# Patient Record
Sex: Male | Born: 1980 | Race: Black or African American | Hispanic: No | Marital: Married | Smoking: Current every day smoker
Health system: Southern US, Community
[De-identification: ages and names within clinical notes are randomized; demographics above are authoritative.]

## PROBLEM LIST (undated history)

## (undated) DIAGNOSIS — R002 Palpitations: Secondary | ICD-10-CM

## (undated) DIAGNOSIS — I4891 Unspecified atrial fibrillation: Secondary | ICD-10-CM

## (undated) DIAGNOSIS — I10 Essential (primary) hypertension: Secondary | ICD-10-CM

## (undated) HISTORY — DX: Palpitations: R00.2

## (undated) HISTORY — DX: Essential (primary) hypertension: I10

---

## 2012-10-20 LAB — PROTIME-INR

## 2013-11-26 ENCOUNTER — Ambulatory Visit (INDEPENDENT_AMBULATORY_CARE_PROVIDER_SITE_OTHER): Payer: Self-pay | Admitting: Cardiovascular Disease

## 2013-11-26 ENCOUNTER — Encounter: Payer: Self-pay | Admitting: Cardiovascular Disease

## 2013-11-26 VITALS — BP 162/110 | HR 73 | Ht 70.5 in | Wt 255.5 lb

## 2013-11-26 DIAGNOSIS — I4891 Unspecified atrial fibrillation: Secondary | ICD-10-CM

## 2013-11-26 DIAGNOSIS — G4733 Obstructive sleep apnea (adult) (pediatric): Secondary | ICD-10-CM | POA: Insufficient documentation

## 2013-11-26 DIAGNOSIS — R5381 Other malaise: Secondary | ICD-10-CM

## 2013-11-26 DIAGNOSIS — I10 Essential (primary) hypertension: Secondary | ICD-10-CM

## 2013-11-26 DIAGNOSIS — R5383 Other fatigue: Secondary | ICD-10-CM

## 2013-11-26 DIAGNOSIS — I480 Paroxysmal atrial fibrillation: Secondary | ICD-10-CM | POA: Insufficient documentation

## 2013-11-26 DIAGNOSIS — Z720 Tobacco use: Secondary | ICD-10-CM | POA: Insufficient documentation

## 2013-11-26 DIAGNOSIS — E668 Other obesity: Secondary | ICD-10-CM | POA: Insufficient documentation

## 2013-11-26 DIAGNOSIS — I119 Hypertensive heart disease without heart failure: Secondary | ICD-10-CM

## 2013-11-26 DIAGNOSIS — F17200 Nicotine dependence, unspecified, uncomplicated: Secondary | ICD-10-CM

## 2013-11-26 DIAGNOSIS — E785 Hyperlipidemia, unspecified: Secondary | ICD-10-CM

## 2013-11-26 DIAGNOSIS — E669 Obesity, unspecified: Secondary | ICD-10-CM

## 2013-11-26 MED ORDER — DILTIAZEM HCL ER COATED BEADS 180 MG PO CP24: ORAL_CAPSULE | ORAL | Status: DC

## 2013-11-26 NOTE — Patient Instructions (Signed)
Your physician recommends that you return for lab work fasting.  Your physician has recommended that you have a sleep study. This test records several body functions during sleep, including: brain activity, eye movement, oxygen and carbon dioxide blood levels, heart rate and rhythm, breathing rate and rhythm, the flow of air through your mouth and nose, snoring, body muscle movements, and chest and belly movement.  Your physician has requested that you have an echocardiogram. Echocardiography is a painless test that uses sound waves to create images of your heart. It provides your doctor with information about the size and shape of your heart and how well your heart's chambers and valves are working. This procedure takes approximately one hour. There are no restrictions for this procedure.  .Your physician has recommended you make the following change in your medication: start new prescription for diltiazem 180 mg. This has already been sent to the pharmacy.  Start ASA 81 mg.  Your physician recommends that you schedule a follow-up appointment in: 2-3 months.

## 2013-11-26 NOTE — Progress Notes (Addendum)
Patient ID: Erik Walter, male   DOB: [birth year], 33 y.o.   MRN: [id number]     PATIENT PROFILE: Erik Walter is a 33 y.o. male who is referred for cardiology evaluation, having recently moved to the [place] area from [place].   HPI:  Erik Walter is a 33 y.o. male who admits to a history of paroxysmal atrial fibrillation.  He tells me that while living in [place].  He was hospitalized on 3 occasions in 2011, 2013, and most recently in 2014 for atrial fibrillation.  Remotely, he had been on Cardizem, and following his last episode Ranexa was even added to his medical regimen.  He does have a history of hemorrhoids.  He developed some mild rectal bleeding on Xarelto.  Ultimately, he has stopped taking all his medications.  He moved to the [place] area approximately 3 months ago.  He now presents to establish cardiology care.  He denies episodes of chest pain.  History is notable in that he started smoking approximately 2 years ago.  Upon further questioning, the patient does snore very loudly.  His sleep is nonrestorative.  He does have frequent awakenings.  He also had daytime sleepiness.  He has never been evaluated for sleep apnea.   Past Medical History  Diagnosis Date  . Hypertension     History reviewed. No pertinent past surgical history.  Not on File  Current Outpatient Prescriptions  Medication Sig Dispense Refill  . diltiazem (CARDIZEM CD) 180 MG 24 hr capsule Take one capsule at night  30 capsule  6   No current facility-administered medications for this visit.    Socially, he is recently married for 7 months.  However, he has 3 children, ages 614, 4010, and 2.  He does smoke cigarettes for the past 2-1/2 years, currently one half pack per day.  He does drink occasional beer 3 days per week.  He does not routinely exercise.  He completed 11 grades of education.  He currently is unemployed.  Family History  Problem Relation Age of Onset  . Alcoholism Mother     . Other Father     motocycle accident  . Other Sister     P 10 MUTATION;  cowden syndrome  . Cancer - Other Maternal Grandmother     ROS General: Negative; No fevers, chills, or night sweats HEENT: Negative; No changes in vision or hearing, sinus congestion, difficulty swallowing Pulmonary: Negative; No cough, wheezing, shortness of breath, hemoptysis Cardiovascular:  See HPI; No chest pain, presyncope, syncope, palpitations, edema GI: Negative; No nausea, vomiting, diarrhea, or abdominal pain GU: Negative; No dysuria, hematuria, or difficulty voiding Musculoskeletal: Negative; no myalgias, joint pain, or weakness Hematologic/Oncologic: Negative; no easy bruising, bleeding Endocrine: Negative; no heat/cold intolerance; no diabetes Neuro: Negative; no changes in balance, headaches Skin: Negative; No rashes or skin lesions Psychiatric: Negative; No behavioral problems, depression Sleep: Loud snoring, daytime sleepiness, hypersomnolence; no bruxism, restless legs, hypnogagnic hallucinations Other comprehensive 14 point system review is negative   Physical Exam BP 162/110  Pulse 73  Ht 5' 10.5" (1.791 m)  Wt 255 lb 8 oz (115.894 kg)  BMI 36.13 kg/m2 Repeat blood pressure was 150/92 retaken by me. General: Alert, oriented, no distress.  Skin: normal turgor, no rashes, warm and dry HEENT: Normocephalic, atraumatic. Pupils equal round and reactive to light; sclera anicteric; extraocular muscles intact; Fundi mild arterial narrowing Nose without nasal septal hypertrophy Mouth/Parynx benign; Mallinpatti scale 3 Neck: Very thick neck ;No JVD, no carotid bruits; normal carotid  upstroke Lungs: clear to ausculatation and percussion; no wheezing or rales Chest wall: without tenderness to palpitation Heart: PMI not displaced, RRR, s1 s2 normal, 1/6 systolic murmur, no diastolic murmur, no rubs, gallops, thrills, or heaves Abdomen: soft, nontender; no hepatosplenomehaly, BS+; abdominal  aorta nontender and not dilated by palpation. Back: no CVA tenderness Pulses 2+ Musculoskeletal: full range of motion, normal strength, no joint deformities Extremities: Trivial ankle edema no clubbing cyanosis, Homan's sign negative  Neurologic: grossly nonfocal; Cranial nerves grossly wnl Psychologic: Normal mood and affect   ECG (independently read by me): Normal sinus rhythm at 73 beats per minute.  Nondiagnostic T changes in leads 3 aVF and V3.  LABS:  BMET No results found for this basename: na, k, cl, co2, glucose, bun, creatinine, calcium, gfrnonaa, gfraa     Hepatic Function Panel  No results found for this basename: prot, albumin, ast, alt, alkphos, bilitot, bilidir, ibili     CBC No results found for this basename: wbc, rbc, hgb, hct, plt, mcv, mch, mchc, rdw, neutrabs, lymphsabs, monoabs, eosabs, basosabs     BNP No results found for this basename: probnp    Lipid Panel  No results found for this basename: chol, trig, hdl, cholhdl, vldl, ldlcalc      RADIOLOGY: No results found.   ASSESSMENT AND PLAN: Erik Walter is a 33 year old, African American male, who has a history of obesity, and apparently has been hospitalized on 3 different occasions for atrial fibrillation dating back to 2011.  Remotely, he had been on rate control medication, as well as anticoagulation with Xarelto.  Currently, he has stopped all medication.  His physical habitus and history is also highly suggestive of obstructive sleep apnea, which may be playing a role in his  Atrial fibrillation development, as well as his current blood pressure elevation.  I discussed at length normal sleep architecture and the adverse effects of obstructive sleep apnea on normal sleep pattern, as well as increased cardiovascular risk and particularly with reference to atrial fibrillation, and hypertension.  I also had a long discussion with him with counseling and strongly recommended discontinuance of his  recent tobacco habit.  Presently, I have recommended the addition of a baby aspirin 81 mg.  I scheduled him for a complete set of blood work to be done in the fasting state, consisting of Cmet, CBC, magnesium, vitamin D level, thyroid function studies, and lipid panel.  He will  undergo a 2-D echo Doppler study to evaluate systolic as well as diastolic function, as well as chamber dimensions. I am scheduling him for a diagnostic polysomnogram due to the high likelihood of having obstructive sleep apnea.  I prescribed Cardizem CD 180 mg, which be helpful both for rate control and blood pressure control which he states he tolerated this well in the past.  We discussed the importance of weight loss and smoking cessation.  I will see him back in the office in 2-3 months for followup evaluation and further recommendations will be made at that time.   Lennette Biharihomas A. Kelly, MD, [facility] 11/26/2013 6:37 PM

## 2013-12-13 ENCOUNTER — Ambulatory Visit (HOSPITAL_COMMUNITY)
Admission: RE | Admit: 2013-12-13 | Discharge: 2013-12-13 | Disposition: A | Discharge status: Home / Self Care | Payer: Medicaid Other | Source: Ambulatory Visit | Attending: Cardiovascular Disease | Admitting: Cardiovascular Disease

## 2013-12-13 DIAGNOSIS — E785 Hyperlipidemia, unspecified: Secondary | ICD-10-CM | POA: Insufficient documentation

## 2013-12-13 DIAGNOSIS — R5381 Other malaise: Secondary | ICD-10-CM

## 2013-12-13 DIAGNOSIS — I4891 Unspecified atrial fibrillation: Secondary | ICD-10-CM | POA: Diagnosis not present

## 2013-12-13 DIAGNOSIS — I119 Hypertensive heart disease without heart failure: Secondary | ICD-10-CM | POA: Insufficient documentation

## 2013-12-13 DIAGNOSIS — R5383 Other fatigue: Secondary | ICD-10-CM

## 2013-12-13 DIAGNOSIS — I517 Cardiomegaly: Secondary | ICD-10-CM

## 2013-12-13 LAB — LIPID PANEL
CHOL/HDL RATIO: 4.1 ratio
CHOLESTEROL: 264 mg/dL — AB (ref 0–200)
HDL: 64 mg/dL (ref >39)
LDL Cholesterol: 173 mg/dL — ABNORMAL HIGH (ref 0–99)
TRIGLYCERIDES: 137 mg/dL (ref <150)
VLDL: 27 mg/dL (ref 0–40)

## 2013-12-13 LAB — CBC
HCT: 43.5 % (ref 39.0–52.0)
Hemoglobin: 14.7 g/dL (ref 13.0–17.0)
MCH: 28.3 pg (ref 26.0–34.0)
MCHC: 33.8 g/dL (ref 30.0–36.0)
MCV: 83.8 fL (ref 78.0–100.0)
Platelets: 251 10*3/uL (ref 150–400)
RBC: 5.19 MIL/uL (ref 4.22–5.81)
RDW: 14.4 % (ref 11.5–15.5)
WBC: 5.3 10*3/uL (ref 4.0–10.5)

## 2013-12-13 LAB — COMPREHENSIVE METABOLIC PANEL
ALT: 39 U/L (ref 0–53)
AST: 29 U/L (ref 0–37)
Albumin: 4.6 g/dL (ref 3.5–5.2)
Alkaline Phosphatase: 54 U/L (ref 39–117)
BUN: 13 mg/dL (ref 6–23)
CALCIUM: 9.7 mg/dL (ref 8.4–10.5)
CO2: 27 mEq/L (ref 19–32)
Chloride: 101 mEq/L (ref 96–112)
Creat: 1.34 mg/dL (ref 0.50–1.35)
Glucose, Bld: 100 mg/dL — ABNORMAL HIGH (ref 70–99)
Potassium: 4.2 mEq/L (ref 3.5–5.3)
Sodium: 138 mEq/L (ref 135–145)
Total Bilirubin: 0.4 mg/dL (ref 0.2–1.2)
Total Protein: 7.3 g/dL (ref 6.0–8.3)

## 2013-12-13 LAB — T3, FREE: T3 FREE: 2.8 pg/mL (ref 2.3–4.2)

## 2013-12-13 LAB — T4, FREE: Free T4: 1.15 ng/dL (ref 0.80–1.80)

## 2013-12-13 LAB — MAGNESIUM: Magnesium: 1.7 mg/dL (ref 1.5–2.5)

## 2013-12-13 LAB — TSH: TSH: 1.243 u[IU]/mL (ref 0.350–4.500)

## 2013-12-13 NOTE — Progress Notes (Signed)
2D Echocardiogram Complete.  12/13/2013   Bret Stamour, RDCS  

## 2013-12-16 LAB — VITAMIN D 1,25 DIHYDROXY
VITAMIN D3 1, 25 (OH): 56 pg/mL
Vitamin D 1, 25 (OH)2 Total: 56 pg/mL (ref 18–72)

## 2013-12-18 ENCOUNTER — Telehealth: Payer: Self-pay | Admitting: Cardiovascular Disease

## 2013-12-18 NOTE — Telephone Encounter (Signed)
Called c/o weakness, dizziness and depression since starting Cardizem 180mg .  He is taking at bedtime because he had similar symptoms in the past on 120mg  taking in the AM.  Also wants Dr. Tresa EndoKelly to be aware his father is a [place] Vet and was in contact with Agent Orange and was told this could be transferred to children born after the fact.  Will have Dr. Tresa EndoKelly review and advise.

## 2013-12-18 NOTE — Telephone Encounter (Signed)
Please call,having problem with his Cardizem.

## 2013-12-18 NOTE — Telephone Encounter (Signed)
Would take at night. Re-check BP; [facility]

## 2013-12-19 NOTE — Telephone Encounter (Signed)
Called back w/Dr. Landry DykeKelly's recommendations.  Told to get a BP monitor to check BP's at home.  States he did not take Cardizem last PM and feels better but doesn't know what his BP is today.  Expressed importance of taking meds for heart rhythm and BP control.  Patient very worked up this AM.  Asked about Sleep Study - order is in will discuss w/scheduling to see when this can be done.  Asked about recent labs and echo results.  Preliminary results given.  Will schedule appt w/Dr. Tresa EndoKelly or APP next available.  Patient voiced understanding.

## 2013-12-20 ENCOUNTER — Encounter: Payer: Self-pay | Admitting: Cardiology

## 2013-12-20 ENCOUNTER — Ambulatory Visit (INDEPENDENT_AMBULATORY_CARE_PROVIDER_SITE_OTHER): Payer: Medicaid Other | Admitting: Cardiology

## 2013-12-20 VITALS — BP 140/104 | HR 98 | Ht 70.5 in | Wt 245.4 lb

## 2013-12-20 DIAGNOSIS — F17200 Nicotine dependence, unspecified, uncomplicated: Secondary | ICD-10-CM

## 2013-12-20 DIAGNOSIS — I4891 Unspecified atrial fibrillation: Secondary | ICD-10-CM

## 2013-12-20 DIAGNOSIS — Z79899 Other long term (current) drug therapy: Secondary | ICD-10-CM

## 2013-12-20 DIAGNOSIS — Z720 Tobacco use: Secondary | ICD-10-CM

## 2013-12-20 DIAGNOSIS — I10 Essential (primary) hypertension: Secondary | ICD-10-CM

## 2013-12-20 DIAGNOSIS — E785 Hyperlipidemia, unspecified: Secondary | ICD-10-CM

## 2013-12-20 DIAGNOSIS — I480 Paroxysmal atrial fibrillation: Secondary | ICD-10-CM

## 2013-12-20 MED ORDER — TELMISARTAN 40 MG PO TABS
40.0000 mg | ORAL_TABLET | Freq: Every day | ORAL | Status: DC
Start: 2013-12-20 — End: 2013-12-20

## 2013-12-20 MED ORDER — TELMISARTAN 40 MG PO TABS: 40.0000 mg | ORAL_TABLET | Freq: Every day | ORAL | Status: DC

## 2013-12-20 NOTE — Progress Notes (Addendum)
12/26/2013   PCP: No PCP Per Patient   Chief Complaint  Patient presents with  . Follow-up    results, HTN    Primary Cardiologist:Dr. Bishop Limbo. Kelly   HPI:33 y.o. male who admits to a history of paroxysmal atrial fibrillation. While living in [place] he was hospitalized on 3 occasions in 2011, 2013, and most recently in 2014 for atrial fibrillation. Remotely, he had been on Cardizem, and following his last episode Ranexa was even added to his medical regimen. He does have a history of hemorrhoids. He developed some mild rectal bleeding on Xarelto. Ultimately, he had stopped taking all his medications. [place] to the [place] area -he saw Dr. Tresa EndoKelly for cardiology care.  He denies episodes of chest pain. History is notable in that he started smoking approximately 2 years ago. Upon further questioning, the patient does snore very loudly. His sleep is nonrestorative. He does have frequent awakenings. He also had daytime sleepiness. He has never been evaluated for sleep apnea.      Dr. Tresa EndoKelly ordered an echo. And started ASA 81 mg daily. Also sleep study. Also tobacco cessation was discussed and continues to be.    ECHO: Left ventricle: The cavity size was normal. Wall thickness was increased in a pattern of mild LVH. Systolic function was normal. The estimated ejection fraction was in the range of 55% to 60%. Wall motion was normal; there were no regional wall motion abnormalities. The study is not technically sufficient to allow evaluation of LV diastolic function. - Left atrium: LA Volume/BSA= 12.6 ml/m2. The atrium was normal in size. - Right atrium: The atrium was normal in size.   Today his BP is elevated.  He complains of near syncope, no energy and some edema of his feet. He has held BP meds because he believes it to make him feel bad.  No Known Allergies  Current Outpatient Prescriptions  Medication Sig Dispense Refill  . diltiazem (CARDIZEM CD) 180 MG 24 hr  capsule Take one capsule at night  30 capsule  6  . telmisartan (MICARDIS) 40 MG tablet Take 1 tablet (40 mg total) by mouth daily.  30 tablet  5   No current facility-administered medications for this visit.    Past Medical History  Diagnosis Date  . Hypertension     History reviewed. No pertinent past surgical history.  WUJ:WJXBJYN:WGROS:General:no colds or fevers,  weight down 10 pounds Skin:no rashes or ulcers HEENT:no blurred vision, no congestion CV:see HPI PUL:see HPI GI:no diarrhea constipation or melena, no indigestion GU:no hematuria, no dysuria MS:no joint pain, no claudication Neuro:no syncope, no lightheadedness Endo:no diabetes, no thyroid disease  Wt Readings from Last 3 Encounters:  12/20/13 245 lb 6.4 oz (111.313 kg)  11/26/13 255 lb 8 oz (115.894 kg)   CMP     Component Value Date/Time   NA 138 12/13/2013 0825   K 4.2 12/13/2013 0825   CL 101 12/13/2013 0825   CO2 27 12/13/2013 0825   GLUCOSE 100* 12/13/2013 0825   BUN 13 12/13/2013 0825   CREATININE 1.34 12/13/2013 0825   CALCIUM 9.7 12/13/2013 0825   PROT 7.3 12/13/2013 0825   ALBUMIN 4.6 12/13/2013 0825   AST 29 12/13/2013 0825   ALT 39 12/13/2013 0825   ALKPHOS 54 12/13/2013 0825   BILITOT 0.4 12/13/2013 0825   Lipid Panel     Component Value Date/Time   CHOL 264* 12/13/2013 0823   TRIG 137 12/13/2013 [id number]  HDL 64 12/13/2013 0823   CHOLHDL 4.1 12/13/2013 0823   VLDL 27 12/13/2013 0823   LDLCALC 173* 12/13/2013 0823     PHYSICAL EXAM BP 140/104  Pulse 98  Ht 5' 10.5" (1.791 m)  Wt 245 lb 6.4 oz (111.313 kg)  BMI 34.70 kg/m2  Lying BP 143/106, P 93 Sitting 153/110. P 104 Standing 181/155  P107  General:Pleasant affect, NAD Skin:Warm and dry, brisk capillary refill HEENT:normocephalic, sclera clear, mucus membranes moist Neck:supple, no JVD, no bruits  Heart:S1S2 RRR with soft systolic 1/6 murmur, no gallup, rub or click Lungs:clear without rales, rhonchi, or wheezes NGE:XBMW, non tender, + BS, do not  palpate liver spleen or masses Ext:no lower ext edema, 2+ pedal pulses, 2+ radial pulses Neuro:alert and oriented, MAE, follows commands, + facial symmetry EKG:SR no acute changes  ASSESSMENT AND PLAN Essential hypertension Uncontrolled, micardis 40 mg daily added.  Paroxysmal atrial fibrillation Maintaining SR  Tobacco abuse Encouraged to stop  Hyperlipidemia Lipid Panel     Component Value Date/Time   CHOL 264* 12/13/2013 0823   TRIG 137 12/13/2013 0823   HDL 64 12/13/2013 0823   CHOLHDL 4.1 12/13/2013 0823   VLDL 27 12/13/2013 0823   LDLCALC 173* 12/13/2013 0823   Will need to add statin, but with side effects from meds will hold for now.     Will follow up in 2 weeks on day with Dr. Bishop Limbo in office.

## 2013-12-20 NOTE — Patient Instructions (Signed)
Erik BoozerLaura Ingold, NP has recommended making the following medication changes:  START Micardis 40 mg - take 1 tablet daily  A prescription has been sent to your pharmacy electronically.  [name] has ordered you to have blood work done [employer]. (BMP)  Your physician recommends that you schedule a follow-up appointment in 2 weeks with Erik BoozerLaura Ingold, NP on a day when Dr Tresa EndoKelly is in the office.

## 2013-12-25 ENCOUNTER — Encounter: Payer: Self-pay | Admitting: Cardiology

## 2013-12-25 NOTE — Assessment & Plan Note (Signed)
Uncontrolled, micardis 40 mg daily added.

## 2013-12-26 DIAGNOSIS — E785 Hyperlipidemia, unspecified: Secondary | ICD-10-CM | POA: Insufficient documentation

## 2013-12-26 NOTE — Assessment & Plan Note (Signed)
Lipid Panel     Component Value Date/Time   CHOL 264* 12/13/2013 0823   TRIG 137 12/13/2013 0823   HDL 64 12/13/2013 0823   CHOLHDL 4.1 12/13/2013 0823   VLDL 27 12/13/2013 0823   LDLCALC 173* 12/13/2013 0823   Will need to add statin, but with side effects from meds will hold for now.

## 2013-12-26 NOTE — Assessment & Plan Note (Signed)
Maintaining SR.    

## 2013-12-26 NOTE — Assessment & Plan Note (Signed)
Encouraged to stop. 

## 2013-12-27 ENCOUNTER — Other Ambulatory Visit: Payer: Self-pay | Admitting: *Deleted

## 2013-12-27 DIAGNOSIS — R5383 Other fatigue: Secondary | ICD-10-CM

## 2013-12-27 DIAGNOSIS — G4733 Obstructive sleep apnea (adult) (pediatric): Secondary | ICD-10-CM

## 2013-12-27 DIAGNOSIS — E785 Hyperlipidemia, unspecified: Secondary | ICD-10-CM

## 2013-12-27 DIAGNOSIS — I4891 Unspecified atrial fibrillation: Secondary | ICD-10-CM

## 2013-12-27 DIAGNOSIS — R5381 Other malaise: Secondary | ICD-10-CM

## 2013-12-27 DIAGNOSIS — I119 Hypertensive heart disease without heart failure: Secondary | ICD-10-CM

## 2013-12-27 LAB — BASIC METABOLIC PANEL
BUN: 10 mg/dL (ref 6–23)
CO2: 26 meq/L (ref 19–32)
Calcium: 9.8 mg/dL (ref 8.4–10.5)
Chloride: 101 mEq/L (ref 96–112)
Creat: 1.19 mg/dL (ref 0.50–1.35)
Glucose, Bld: 78 mg/dL (ref 70–99)
Potassium: 4.5 mEq/L (ref 3.5–5.3)
Sodium: 138 mEq/L (ref 135–145)

## 2013-12-30 ENCOUNTER — Telehealth: Payer: Self-pay

## 2013-12-30 MED ORDER — LISINOPRIL 40 MG PO TABS: 40.0000 mg | ORAL_TABLET | Freq: Every day | ORAL | Status: AC

## 2013-12-30 NOTE — Telephone Encounter (Signed)
Received prior authorization request from patient's pharmacy. Spoke with Phillips HayKristin Alvstad, PharmD, she changed medication to Lisinopril 40 mg. Rx was sent to pharmacy electronically. Called patient - understood and agreed with plan.

## 2014-01-01 ENCOUNTER — Other Ambulatory Visit: Payer: Self-pay | Admitting: *Deleted

## 2014-01-01 ENCOUNTER — Encounter: Payer: Self-pay | Admitting: *Deleted

## 2014-01-06 ENCOUNTER — Encounter: Payer: Self-pay | Admitting: *Deleted

## 2014-01-06 NOTE — Telephone Encounter (Signed)
This encounter was created in error - please disregard.

## 2014-01-08 ENCOUNTER — Encounter: Payer: Self-pay | Admitting: Cardiology

## 2014-01-08 ENCOUNTER — Ambulatory Visit (INDEPENDENT_AMBULATORY_CARE_PROVIDER_SITE_OTHER): Payer: Medicaid Other | Admitting: Cardiology

## 2014-01-08 VITALS — BP 144/94 | HR 76 | Ht 70.5 in | Wt 251.0 lb

## 2014-01-08 DIAGNOSIS — I4891 Unspecified atrial fibrillation: Secondary | ICD-10-CM

## 2014-01-08 DIAGNOSIS — I10 Essential (primary) hypertension: Secondary | ICD-10-CM

## 2014-01-08 DIAGNOSIS — F17200 Nicotine dependence, unspecified, uncomplicated: Secondary | ICD-10-CM

## 2014-01-08 DIAGNOSIS — I480 Paroxysmal atrial fibrillation: Secondary | ICD-10-CM

## 2014-01-08 DIAGNOSIS — Z720 Tobacco use: Secondary | ICD-10-CM

## 2014-01-08 NOTE — Assessment & Plan Note (Signed)
Maintaining sinus rhythm 

## 2014-01-08 NOTE — Assessment & Plan Note (Signed)
He understands the importance of stopping

## 2014-01-08 NOTE — Assessment & Plan Note (Signed)
Much improved now that he is on medication. He could tolerate improved control but he is only been on lisinopril about one week. We'll have them check him blood pressure in 4 weeks with Baxter HireKristen he may need hydrochlorothiazide added to his medication. He is very anxious about having the diltiazem increased.  In the meantime he'll continue with his new diet and exercising.  He also has a sleep study planned in the very near future.

## 2014-01-08 NOTE — Progress Notes (Signed)
01/08/2014   PCP: No PCP Per Patient   Chief Complaint  Patient presents with  . Follow-up    2 weeks     Primary Cardiologist:Dr. Bishop Limbo. Kelly   HPI:  33 year old male is returning today for followup for blood pressure evaluation. Last visit 12/20/2013 he was hypertensive and medication adjustments were made. He is also very anxious about his medication and concerned it may be causing side effects. The side effects he described were actually due to hypertension. On the last visit we discussed the symptoms and the importance of controlling his blood pressure to prevent further problems in the future. Other studies have been ordered by Dr. Tresa EndoKelly including sleep study. He has a history of paroxysmal A. fib and is currently on diltiazem without complications, maintaining sinus rhythm. We also discussed tobacco cessation.   Recent ECHO:  Left ventricle: The cavity size was normal. Wall thickness was increased in a pattern of mild LVH. Systolic function was normal. The estimated ejection fraction was in the range of 55% to 60%. Wall motion was normal; there were no regional wall motion abnormalities. The study is not technically sufficient to allow evaluation of LV diastolic function. - Left atrium: LA Volume/BSA= 12.6 ml/m2. The atrium was normal in size. - Right atrium: The atrium was normal in size.  On his last visit I added and are but his insurance did not pay for it so we changed his lisinopril. He has been tolerating this medication without problems.  No Known Allergies  Current Outpatient Prescriptions  Medication Sig Dispense Refill  . aspirin 81 MG tablet Take 81 mg by mouth daily.      Marland Kitchen. diltiazem (CARDIZEM CD) 180 MG 24 hr capsule Take one capsule at night  30 capsule  6  . lisinopril (PRINIVIL,ZESTRIL) 40 MG tablet Take 1 tablet (40 mg total) by mouth daily.  30 tablet  5   No current facility-administered medications for this visit.    Past Medical  History  Diagnosis Date  . Hypertension     History reviewed. No pertinent past surgical history.  ZOX:WRUEAVW:UJROS:General:no colds or fevers, some weight gain, with working out Skin:no rashes or ulcers HEENT:no blurred vision, no congestion CV:see HPI PUL:see HPI GI:no diarrhea constipation or melena, no indigestion GU:no hematuria, no dysuria MS:no joint pain, no claudication Neuro:no syncope, no lightheadedness Endo:no diabetes, no thyroid disease  Wt Readings from Last 3 Encounters:  01/08/14 251 lb (113.853 kg)  12/20/13 245 lb 6.4 oz (111.313 kg)  11/26/13 255 lb 8 oz (115.894 kg)    PHYSICAL EXAM BP 144/94  Pulse 76  Ht 5' 10.5" (1.791 m)  Wt 251 lb (113.853 kg)  BMI 35.49 kg/m2 General:Pleasant affect, NAD Skin:Warm and dry, brisk capillary refill HEENT:normocephalic, sclera clear, mucus membranes moist Neck:supple, no JVD, no bruits  Heart:S1S2 RRR with 1/6 systolic murmur, no gallup, rub or click Lungs:clear without rales, rhonchi, or wheezes WJX:BJYNAbd:soft, non tender, + BS, do not palpate liver spleen or masses Ext:no lower ext edema, 2+ pedal pulses, 2+ radial pulses Neuro:alert and oriented X 3, MAE, follows commands, + facial symmetry WGN:FAOZEKG:none today  ASSESSMENT AND PLAN Essential hypertension Much improved now that he is on medication. He could tolerate improved control but he is only been on lisinopril about one week. We'll have them check him blood pressure in 4 weeks with Baxter HireKristen he may need hydrochlorothiazide added to his medication. He is very anxious about having the diltiazem  increased.  In the meantime he'll continue with his new diet and exercising.  He also has a sleep study planned in the very near future.  Paroxysmal atrial fibrillation Maintaining sinus rhythm  Tobacco abuse He understands the importance of stopping   He'll keep his followup appointment with Dr. Tresa Endo after his sleep study. He'll see Belenda Cruise in 4 weeks for blood pressure recheck.  His  wife was with him today for the visit.

## 2014-01-08 NOTE — Patient Instructions (Signed)
Your physician recommends that you schedule a follow-up appointment in: 4 Weeks with Belenda CruiseKristin for Blood pressure check

## 2014-01-20 ENCOUNTER — Other Ambulatory Visit: Payer: Self-pay | Admitting: *Deleted

## 2014-01-20 ENCOUNTER — Telehealth: Payer: Self-pay | Admitting: *Deleted

## 2014-01-20 DIAGNOSIS — E7800 Pure hypercholesterolemia, unspecified: Secondary | ICD-10-CM

## 2014-01-20 DIAGNOSIS — Z79899 Other long term (current) drug therapy: Secondary | ICD-10-CM

## 2014-01-20 MED ORDER — ATORVASTATIN CALCIUM 20 MG PO TABS
20.0000 mg | ORAL_TABLET | Freq: Every day | ORAL | Status: AC
Start: 2014-01-20 — End: ?

## 2014-01-20 NOTE — Telephone Encounter (Signed)
Informed patient of lab results and recommendations. Atorvastatin 20 mg escribed per dr. Landry DykeKelly's orders to [facility].

## 2014-02-14 ENCOUNTER — Ambulatory Visit: Payer: Medicaid Other | Admitting: Pharmacist Clinician (PhC)/ Clinical Pharmacy Specialist

## 2014-02-17 ENCOUNTER — Ambulatory Visit: Payer: Medicaid Other | Admitting: Pharmacist Clinician (PhC)/ Clinical Pharmacy Specialist

## 2014-02-21 ENCOUNTER — Ambulatory Visit (HOSPITAL_BASED_OUTPATIENT_CLINIC_OR_DEPARTMENT_OTHER): Payer: Medicaid Other | Attending: Cardiovascular Disease | Admitting: Radiology

## 2014-02-21 VITALS — Ht 70.5 in | Wt 250.0 lb

## 2014-02-21 DIAGNOSIS — R5383 Other fatigue: Secondary | ICD-10-CM

## 2014-02-21 DIAGNOSIS — R5381 Other malaise: Secondary | ICD-10-CM | POA: Insufficient documentation

## 2014-02-21 DIAGNOSIS — G4733 Obstructive sleep apnea (adult) (pediatric): Secondary | ICD-10-CM | POA: Diagnosis not present

## 2014-02-21 DIAGNOSIS — I4891 Unspecified atrial fibrillation: Secondary | ICD-10-CM | POA: Insufficient documentation

## 2014-03-06 DIAGNOSIS — G4730 Sleep apnea, unspecified: Secondary | ICD-10-CM

## 2014-03-06 NOTE — Sleep Study (Signed)
   NAME: Erik Walter DATE OF BIRTH:  04-26-1981 MEDICAL RECORD NUMBER [id number]  LOCATION: [facility]  PHYSICIAN: Barbaraann ShareCLANCE,Latasia Silberstein M  DATE OF STUDY: 02/21/2014  SLEEP STUDY TYPE: Nocturnal Polysomnogram               REFERRING PHYSICIAN: Lennette BihariKelly, Thomas A, MD  INDICATION FOR STUDY: Hypersomnia with sleep apnea  EPWORTH SLEEPINESS SCORE:  13 HEIGHT: 5' 10.5" (179.1 cm)  WEIGHT: 250 lb (113.399 kg)    Body mass index is 35.35 kg/(m^2).  NECK SIZE: 17.5 in.  MEDICATIONS: Reviewed in the sleep record  SLEEP ARCHITECTURE: The patient had a total sleep time of 380 minutes, with no slow-wave sleep and only 93 minutes of REM. Sleep onset latency was normal at 17 minutes, and REM onset was at the upper limits of normal. Sleep efficiency was mildly reduced at 87%.  RESPIRATORY DATA: The patient was found to have 3 obstructive apneas and only 4 obstructive hypopneas, giving him an AHI of only 1.1 events per hour. The events occurred primarily in the supine position, and there was moderate snoring noted throughout.  OXYGEN DATA: There was transient oxygen desaturation as low as 90% during the night.  CARDIAC DATA: Mild sinus bradycardia noted at times.  MOVEMENT/PARASOMNIA: No clinically significant limb movements or other abnormal behaviors were seen.  IMPRESSION/ RECOMMENDATION:    1) small numbers of obstructive events which do not meet the AHI criteria for the obstructive sleep apnea syndrome. The patient did have moderate snoring, and should be encouraged to work on weight loss and possibly positional therapy.  2) mild sinus bradycardia noted at times during the night, but no clinically significant arrhythmias were seen.    Barbaraann ShareLANCE,Estell Dillinger M Diplomate, American Board of Sleep Medicine  ELECTRONICALLY SIGNED ON:  03/06/2014, 2:12 PM  [facility] PH: [phone]   FX: [phone] ACCREDITED BY THE AMERICAN ACADEMY OF SLEEP MEDICINE

## 2014-03-27 ENCOUNTER — Encounter (HOSPITAL_COMMUNITY): Payer: Self-pay | Admitting: Emergency Medicine

## 2014-03-27 ENCOUNTER — Emergency Department (HOSPITAL_COMMUNITY)
Admission: EM | Admit: 2014-03-27 | Discharge: 2014-03-28 | Disposition: A | Discharge status: Home / Self Care | Payer: Medicaid Other | Attending: Emergency Medicine | Admitting: Emergency Medicine

## 2014-03-27 ENCOUNTER — Emergency Department (HOSPITAL_COMMUNITY): Payer: Medicaid Other

## 2014-03-27 DIAGNOSIS — Z720 Tobacco use: Secondary | ICD-10-CM | POA: Insufficient documentation

## 2014-03-27 DIAGNOSIS — R079 Chest pain, unspecified: Secondary | ICD-10-CM | POA: Diagnosis not present

## 2014-03-27 DIAGNOSIS — R42 Dizziness and giddiness: Secondary | ICD-10-CM | POA: Diagnosis not present

## 2014-03-27 DIAGNOSIS — R002 Palpitations: Secondary | ICD-10-CM

## 2014-03-27 DIAGNOSIS — Z79899 Other long term (current) drug therapy: Secondary | ICD-10-CM | POA: Diagnosis not present

## 2014-03-27 DIAGNOSIS — I4891 Unspecified atrial fibrillation: Secondary | ICD-10-CM | POA: Diagnosis not present

## 2014-03-27 DIAGNOSIS — R0602 Shortness of breath: Secondary | ICD-10-CM | POA: Diagnosis not present

## 2014-03-27 DIAGNOSIS — Z7982 Long term (current) use of aspirin: Secondary | ICD-10-CM | POA: Diagnosis not present

## 2014-03-27 DIAGNOSIS — I10 Essential (primary) hypertension: Secondary | ICD-10-CM | POA: Diagnosis not present

## 2014-03-27 HISTORY — DX: Unspecified atrial fibrillation: I48.91

## 2014-03-27 LAB — CBC WITH DIFFERENTIAL/PLATELET
BASOS ABS: 0 10*3/uL (ref 0.0–0.1)
Basophils Relative: 0 % (ref 0–1)
Eosinophils Absolute: 0.1 10*3/uL (ref 0.0–0.7)
Eosinophils Relative: 1 % (ref 0–5)
HCT: 38.1 % — ABNORMAL LOW (ref 39.0–52.0)
Hemoglobin: 12.5 g/dL — ABNORMAL LOW (ref 13.0–17.0)
Lymphocytes Relative: 33 % (ref 12–46)
Lymphs Abs: 2.8 10*3/uL (ref 0.7–4.0)
MCH: 28.2 pg (ref 26.0–34.0)
MCHC: 32.8 g/dL (ref 30.0–36.0)
MCV: 85.8 fL (ref 78.0–100.0)
Monocytes Absolute: 0.8 10*3/uL (ref 0.1–1.0)
Monocytes Relative: 9 % (ref 3–12)
NEUTROS ABS: 4.7 10*3/uL (ref 1.7–7.7)
NEUTROS PCT: 57 % (ref 43–77)
Platelets: 234 10*3/uL (ref 150–400)
RBC: 4.44 MIL/uL (ref 4.22–5.81)
RDW: 13.9 % (ref 11.5–15.5)
WBC: 8.3 10*3/uL (ref 4.0–10.5)

## 2014-03-27 MED ORDER — NITROGLYCERIN 0.4 MG SL SUBL
0.4000 mg | SUBLINGUAL_TABLET | SUBLINGUAL | Status: DC | PRN
  Administered 2014-03-28: 0.4 mg via SUBLINGUAL
  Filled 2014-03-27: qty 1

## 2014-03-27 NOTE — ED Notes (Addendum)
Pt states he has been out of his A-Fib and blood pressure medication for a couple of days. Pt states he began feeling like his heart was fluttering. Pt reports feeling some tightness in his chest. Pt is alert and oriented. Pt reports having 3 beers today.

## 2014-03-27 NOTE — ED Notes (Signed)
Patient c/o palpitations x2 hours, with intermittent non radiating chest tightness rated 3/10, patient reports lightheadedness/dizziness, denies diaphoresis.

## 2014-03-27 NOTE — ED Provider Notes (Signed)
CSN: [id number]     Arrival date & time 03/27/14  2217 History   First MD Initiated Contact with Patient 03/27/14 2310     This chart was scribed for non-physician practitioner, Trixie Dredge PA-C working with Gwyneth Sprout, MD by Arlan Organ, ED Scribe. This patient was seen in room WA23/WA23 and the patient's care was started at 11:11 PM.   Chief Complaint  Patient presents with  . Palpitations   The history is provided by the patient and the spouse. No language interpreter was used.    HPI Comments: Erik Walter is a 33 y.o. male who presents to the Emergency Department complaining of intermittent, moderate palpitations onset 9 PM this evening. States heart is skipping beats and intermittently beating hard. Pt also reports intermittent SOB, lightheadedness with exertion, and chest pressure rated 3/10 that occur when he is having the palpitations. Chest pressure lasts for seconds to minutes when episodes occur.   He has the episodes 3 times a day when he is working and approximately once when he is at home.  They do occur at rest but seem to occur more when he is exerting himself.  Has had these symptoms intermittently for years and has been admitted and also seen cardiology for this.  States the symptoms have seemed more intense over the past few days and tonight and this is why he came in.  No leg swelling but admits to intermittent swelling to the feet bilaterally. He denies a family history of MI or stokes at young age. Erik Walter has been out of his medications for the last few days.  He is followed by Dr. Nicki Guadalajara at [facility] He is not followed by a PCP at this time  Past Medical History  Diagnosis Date  . Hypertension   . A-fib    History reviewed. No pertinent past surgical history. Family History  Problem Relation Age of Onset  . Alcoholism Mother   . Other Father     motocycle accident  . Other Sister     P 10 MUTATION;  cowden syndrome  . Cancer - Other  Maternal Grandmother    History  Substance Use Topics  . Smoking status: Current Every Day Smoker -- 0.50 packs/day for 3 years    Types: Cigarettes  . Smokeless tobacco: Not on file  . Alcohol Use: Yes     Comment: 12 pack beer on the weekend    Review of Systems  Respiratory: Positive for chest tightness and shortness of breath.   Cardiovascular: Positive for palpitations.  Neurological: Positive for light-headedness.  All other systems reviewed and are negative.     Allergies  Review of patient's allergies indicates no known allergies.  Home Medications   Prior to Admission medications   Medication Sig Start Date End Date Taking? Authorizing Provider  aspirin 81 MG tablet Take 81 mg by mouth daily.   Yes Historical Provider, MD  atorvastatin (LIPITOR) 20 MG tablet Take 1 tablet (20 mg total) by mouth daily. 01/20/14  Yes Lennette Bihari, MD  diltiazem (CARDIZEM CD) 180 MG 24 hr capsule Take one capsule at night 11/26/13  Yes Lennette Bihari, MD  lisinopril (PRINIVIL,ZESTRIL) 40 MG tablet Take 1 tablet (40 mg total) by mouth daily. 12/30/13  Yes Leone Brand, NP   Triage Vitals: BP 147/88  Pulse 66  Temp(Src) 99.1 F (37.3 C) (Oral)  Resp 16  SpO2 99%   Physical Exam  Nursing note and vitals reviewed. Constitutional:  He appears well-developed and well-nourished. No distress.  HENT:  Head: Normocephalic and atraumatic.  Neck: Neck supple.  Cardiovascular: Normal rate and regular rhythm.   Pulmonary/Chest: Effort normal and breath sounds normal. No respiratory distress. He has no wheezes. He has no rales.  Abdominal: Soft. He exhibits no distension and no mass. There is no tenderness. There is no rebound and no guarding.  Musculoskeletal: He exhibits no edema.  Neurological: He is alert. He exhibits normal muscle tone.  Skin: He is not diaphoretic.    ED Course  Procedures (including critical care time)  DIAGNOSTIC STUDIES: Oxygen Saturation is 99% on RA, Normal  by my interpretation.    COORDINATION OF CARE: 11:11 PM-Discussed treatment plan with pt at bedside and pt agreed to plan.     Labs Review Labs Reviewed - No data to display  Imaging Review No results found.   EKG Interpretation   Date/Time:  Thursday March 27 2014 23:14:19 EDT Ventricular Rate:  64 PR Interval:  156 QRS Duration: 86 QT Interval:  397 QTC Calculation: 410 R Axis:   74 Text Interpretation:  Sinus rhythm Borderline T wave abnormalities No  previous tracing Confirmed by Anitra LauthPLUNKETT  MD, WHITNEY ([id number]) on 03/27/2014  11:28:50 PM      Repeat EKG while patient having symptoms is unchanged.    12:26 AM Patient reports no relief with nitro  1:10 AM Discussed patient with Dr Preston FleetingGlick.    1:44 AM I spoke with patient and his significant other about all of the results and offered him admission for continued monitoring and chest pain workup.  He declined.  States he has had all of these symptoms before and feels that he has been thoroughly worked up, does not want admission at this time.  Will be able to follow up with his cardiologist soon.   MDM   Final diagnoses:  Chest pain  Palpitations    Afebrile nontoxic patient with palpitations, intermittent central chest pain with SOB and lightheadedness.  Has noted exertional symptoms occasionally over the past few weeks but also symptoms at rest - this is unchanged from the symptoms he states he has had for years.  Has been off of his medications for HTN, afib.  However, he has been in sinus rhythm on both EKGs performed and every time I have been in the room.  Apparently our system is currently unable to look at past rhythms or print rhythm strips and it is for this reason I have offered admission - pt associates his symptoms with being in afib but it is not clear if he is going in and out of afib here but is having symptoms.  Has had echo but denies stress test or hx cardiac cath.  Echo 12/13/13 was normal.   I offered  admission for chest pain workup for this patient but he declines, stating his symptoms are unchanged from past few years and he feels he has been thoroughly checked out for them.  He simply wants to be back on his medications.  He verbalizes understanding of my concern about his symptoms being present without afib.  Declines further workup and admission.  D/C home with cardiology follow up.  Discussed result, findings, treatment, and follow up  with patient.  Pt given return precautions.  Pt verbalizes understanding and agrees with plan.        I personally performed the services described in this documentation, which was scribed in my presence. The recorded information has been reviewed and  is accurate.    RivesEmily Lucah Petta, PA-C 03/28/14 [phone]

## 2014-03-28 LAB — TROPONIN I: Troponin I: <0.3 ng/mL (ref <0.30)

## 2014-03-28 LAB — BASIC METABOLIC PANEL
ANION GAP: 12 (ref 5–15)
BUN: 13 mg/dL (ref 6–23)
CHLORIDE: 102 meq/L (ref 96–112)
CO2: 24 meq/L (ref 19–32)
Calcium: 9.2 mg/dL (ref 8.4–10.5)
Creatinine, Ser: 1.22 mg/dL (ref 0.50–1.35)
GFR calc non Af Amer: 75 mL/min — ABNORMAL LOW (ref >90)
GFR, EST AFRICAN AMERICAN: 87 mL/min — AB (ref >90)
Glucose, Bld: 95 mg/dL (ref 70–99)
POTASSIUM: 4 meq/L (ref 3.7–5.3)
Sodium: 138 mEq/L (ref 137–147)

## 2014-03-28 MED ORDER — ACETAMINOPHEN 325 MG PO TABS
650.0000 mg | ORAL_TABLET | Freq: Once | ORAL | Status: AC
  Administered 2014-03-28: 650 mg via ORAL
  Filled 2014-03-28: qty 2

## 2014-03-28 MED ORDER — DILTIAZEM HCL ER COATED BEADS 180 MG PO CP24: 180.0000 mg | ORAL_CAPSULE | Freq: Every day | ORAL | Status: DC

## 2014-03-28 MED ORDER — ASPIRIN 81 MG PO CHEW
81.0000 mg | CHEWABLE_TABLET | Freq: Once | ORAL | Status: AC
  Administered 2014-03-28: 81 mg via ORAL
  Filled 2014-03-28: qty 1

## 2014-03-28 MED ORDER — DILTIAZEM HCL ER COATED BEADS 180 MG PO CP24
180.0000 mg | ORAL_CAPSULE | Freq: Once | ORAL | Status: AC
  Administered 2014-03-28: 180 mg via ORAL
  Filled 2014-03-28: qty 1

## 2014-03-28 MED ORDER — LISINOPRIL 40 MG PO TABS
40.0000 mg | ORAL_TABLET | Freq: Once | ORAL | Status: AC
  Administered 2014-03-28: 40 mg via ORAL
  Filled 2014-03-28: qty 1

## 2014-03-28 MED ORDER — LISINOPRIL 40 MG PO TABS: 40.0000 mg | ORAL_TABLET | Freq: Every day | ORAL | Status: AC

## 2014-03-28 NOTE — Discharge Instructions (Signed)
Read the information below.  Use the prescribed medication as directed.  Please discuss all new medications with your pharmacist.  You may return to the Emergency Department at any time for worsening condition or any new symptoms that concern you.  If you develop worsening chest pain, shortness of breath, fever, you pass out, or become weak or dizzy, return to the ER for a recheck.     Check with your pharmacist, it appears on our records that you have several refills left on all of your medications.     Chest Pain (Nonspecific) It is often hard to give a specific diagnosis for the cause of chest pain. There is always a chance that your pain could be related to something serious, such as a heart attack or a blood clot in the lungs. You need to follow up with your health care provider for further evaluation. CAUSES   Heartburn.  Pneumonia or bronchitis.  Anxiety or stress.  Inflammation around your heart (pericarditis) or lung (pleuritis or pleurisy).  A blood clot in the lung.  A collapsed lung (pneumothorax). It can develop suddenly on its own (spontaneous pneumothorax) or from trauma to the chest.  Shingles infection (herpes zoster virus). The chest wall is composed of bones, muscles, and cartilage. Any of these can be the source of the pain.  The bones can be bruised by injury.  The muscles or cartilage can be strained by coughing or overwork.  The cartilage can be affected by inflammation and become sore (costochondritis). DIAGNOSIS  Lab tests or other studies may be needed to find the cause of your pain. Your health care provider may have you take a test called an ambulatory electrocardiogram (ECG). An ECG records your heartbeat patterns over a 24-hour period. You may also have other tests, such as:  Transthoracic echocardiogram (TTE). During echocardiography, sound waves are used to evaluate how blood flows through your heart.  Transesophageal echocardiogram (TEE).  Cardiac  monitoring. This allows your health care provider to monitor your heart rate and rhythm in real time.  Holter monitor. This is a portable device that records your heartbeat and can help diagnose heart arrhythmias. It allows your health care provider to track your heart activity for several days, if needed.  Stress tests by exercise or by giving medicine that makes the heart beat faster. TREATMENT   Treatment depends on what may be causing your chest pain. Treatment may include:  Acid blockers for heartburn.  Anti-inflammatory medicine.  Pain medicine for inflammatory conditions.  Antibiotics if an infection is present.  You may be advised to change lifestyle habits. This includes stopping smoking and avoiding alcohol, caffeine, and chocolate.  You may be advised to keep your head raised (elevated) when sleeping. This reduces the chance of acid going backward from your stomach into your esophagus. Most of the time, nonspecific chest pain will improve within 2-3 days with rest and mild pain medicine.  HOME CARE INSTRUCTIONS   If antibiotics were prescribed, take them as directed. Finish them even if you start to feel better.  For the next few days, avoid physical activities that bring on chest pain. Continue physical activities as directed.  Do not use any tobacco products, including cigarettes, chewing tobacco, or electronic cigarettes.  Avoid drinking alcohol.  Only take medicine as directed by your health care provider.  Follow your health care provider's suggestions for further testing if your chest pain does not go away.  Keep any follow-up appointments you made. If you  do not go to an appointment, you could develop lasting (chronic) problems with pain. If there is any problem keeping an appointment, call to reschedule. SEEK MEDICAL CARE IF:   Your chest pain does not go away, even after treatment.  You have a rash with blisters on your chest.  You have a fever. SEEK  IMMEDIATE MEDICAL CARE IF:   You have increased chest pain or pain that spreads to your arm, neck, jaw, back, or abdomen.  You have shortness of breath.  You have an increasing cough, or you cough up blood.  You have severe back or abdominal pain.  You feel nauseous or vomit.  You have severe weakness.  You faint.  You have chills. This is an emergency. Do not wait to see if the pain will go away. Get medical help at once. Call your local emergency services (911 in [place] Do not drive yourself to the hospital. MAKE SURE YOU:   Understand these instructions.  Will watch your condition.  Will get help right away if you are not doing well or get worse. Document Released: 03/02/2005 Document Revised: 05/28/2013 Document Reviewed: 12/27/2007 [facility] Patient Information 2015 [place], [place]. This information is not intended to replace advice given to you by your health care provider. Make sure you discuss any questions you have with your health care provider.  Atrial Fibrillation Atrial fibrillation is a type of irregular heart rhythm (arrhythmia). During atrial fibrillation, the upper chambers of the heart (atria) quiver continuously in a chaotic pattern. This causes an irregular and often rapid heart rate.  Atrial fibrillation is the result of the heart becoming overloaded with disorganized signals that tell it to beat. These signals are normally released one at a time by a part of the right atrium called the sinoatrial node. They then travel from the atria to the lower chambers of the heart (ventricles), causing the atria and ventricles to contract and pump blood as they pass. In atrial fibrillation, parts of the atria outside of the sinoatrial node also release these signals. This results in two problems. First, the atria receive so many signals that they do not have time to fully contract. Second, the ventricles, which can only receive one signal at a time, beat irregularly and out of  rhythm with the atria.  There are three types of atrial fibrillation:   Paroxysmal. Paroxysmal atrial fibrillation starts suddenly and stops on its own within a week.  Persistent. Persistent atrial fibrillation lasts for more than a week. It may stop on its own or with treatment.  Permanent. Permanent atrial fibrillation does not go away. Episodes of atrial fibrillation may lead to permanent atrial fibrillation. Atrial fibrillation can prevent your heart from pumping blood normally. It increases your risk of stroke and can lead to heart failure.  CAUSES   Heart conditions, including a heart attack, heart failure, coronary artery disease, and heart valve conditions.   Inflammation of the sac that surrounds the heart (pericarditis).  Blockage of an artery in the lungs (pulmonary embolism).  Pneumonia or other infections.  Chronic lung disease.  Thyroid problems, especially if the thyroid is overactive (hyperthyroidism).  Caffeine, excessive alcohol use, and use of some illegal drugs.   Use of some medicines, including certain decongestants and diet pills.  Heart surgery.   Birth defects.  Sometimes, no cause can be found. When this happens, the atrial fibrillation is called lone atrial fibrillation. The risk of complications from atrial fibrillation increases if you have lone atrial fibrillation  and you are age 35 years or older. RISK FACTORS  Heart failure.  Coronary artery disease.  Diabetes mellitus.   High blood pressure (hypertension).   Obesity.   Other arrhythmias.   Increased age. SIGNS AND SYMPTOMS   A feeling that your heart is beating rapidly or irregularly.   A feeling of discomfort or pain in your chest.   Shortness of breath.   Sudden light-headedness or weakness.   Getting tired easily when exercising.   Urinating more often than normal (mainly when atrial fibrillation first begins).  In paroxysmal atrial fibrillation, symptoms may  start and suddenly stop. DIAGNOSIS  Your health care provider may be able to detect atrial fibrillation when taking your pulse. Your health care provider may have you take a test called an ambulatory electrocardiogram (ECG). An ECG records your heartbeat patterns over a 24-hour period. You may also have other tests, such as:  Transthoracic echocardiogram (TTE). During echocardiography, sound waves are used to evaluate how blood flows through your heart.  Transesophageal echocardiogram (TEE).  Stress test. There is more than one type of stress test. If a stress test is needed, ask your health care provider about which type is best for you.  Chest X-ray exam.  Blood tests.  Computed tomography (CT). TREATMENT  Treatment may include:  Treating any underlying conditions. For example, if you have an overactive thyroid, treating the condition may correct atrial fibrillation.  Taking medicine. Medicines may be given to control a rapid heart rate or to prevent blood clots, heart failure, or a stroke.  Having a procedure to correct the rhythm of the heart:  Electrical cardioversion. During electrical cardioversion, a controlled, low-energy shock is delivered to the heart through your skin. If you have chest pain, very low blood pressure, or sudden heart failure, this procedure may need to be done as an emergency.  Catheter ablation. During this procedure, heart tissues that send the signals that cause atrial fibrillation are destroyed.  Surgical ablation. During this surgery, thin lines of heart tissue that carry the abnormal signals are destroyed. This procedure can either be an open-heart surgery or a minimally invasive surgery. With the minimally invasive surgery, small cuts are made to access the heart instead of a large opening.  Pulmonary venous isolation. During this surgery, tissue around the veins that carry blood from the lungs (pulmonary veins) is destroyed. This tissue is thought to  carry the abnormal signals. HOME CARE INSTRUCTIONS   Take medicines only as directed by your health care provider. Some medicines can make atrial fibrillation worse or recur.  If blood thinners were prescribed by your health care provider, take them exactly as directed. Too much blood-thinning medicine can cause bleeding. If you take too little, you will not have the needed protection against stroke and other problems.  Perform blood tests at home if directed by your health care provider. Perform blood tests exactly as directed.  Quit smoking if you smoke.  Do not drink alcohol.  Do not drink caffeinated beverages such as coffee, soda, and some teas. You may drink decaffeinated coffee, soda, or tea.   Maintain a healthy weight.Do not use diet pills unless your health care provider approves. They may make heart problems worse.   Follow diet instructions as directed by your health care provider.  Exercise regularly as directed by your health care provider.  Keep all follow-up visits as directed by your health care provider. This is important. PREVENTION  The following substances can cause atrial fibrillation  to recur:   Caffeinated beverages.  Alcohol.  Certain medicines, especially those used for breathing problems.  Certain herbs and herbal medicines, such as those containing ephedra or ginseng.  Illegal drugs, such as cocaine and amphetamines. Sometimes medicines are given to prevent atrial fibrillation from recurring. Proper treatment of any underlying condition is also important in helping prevent recurrence.  SEEK MEDICAL CARE IF:  You notice a change in the rate, rhythm, or strength of your heartbeat.  You suddenly begin urinating more frequently.  You tire more easily when exerting yourself or exercising. SEEK IMMEDIATE MEDICAL CARE IF:   You have chest pain, abdominal pain, sweating, or weakness.  You feel nauseous.  You have shortness of breath.  You  suddenly have swollen feet and ankles.  You feel dizzy.  Your face or limbs feel numb or weak.  You have a change in your vision or speech. MAKE SURE YOU:   Understand these instructions.  Will watch your condition.  Will get help right away if you are not doing well or get worse. Document Released: 05/23/2005 Document Revised: 10/07/2013 Document Reviewed: 07/03/2012 [facility] Patient Information 2015 [place], [place]. This information is not intended to replace advice given to you by your health care provider. Make sure you discuss any questions you have with your health care provider.

## 2014-03-28 NOTE — ED Provider Notes (Signed)
Medical screening examination/treatment/procedure(s) were performed by non-physician practitioner and as supervising physician I was immediately available for consultation/collaboration.   EKG Interpretation   Date/Time:  Thursday March 27 2014 23:14:19 EDT Ventricular Rate:  64 PR Interval:  156 QRS Duration: 86 QT Interval:  397 QTC Calculation: 410 R Axis:   74 Text Interpretation:  Sinus rhythm Borderline T wave abnormalities No  previous tracing Confirmed by Anitra LauthPLUNKETT  MD, WHITNEY ([id number]) on 03/27/2014  11:28:50 PM        Erik Boozeavid Isac Lincks, MD 03/28/14 [phone]

## 2014-03-28 NOTE — ED Notes (Signed)
Patient reports no longer having "chest tightness" after first SL Nitro. Patient reports had another episode of "heart flutters". Irving BurtonEmily, PA-C made aware of same.

## 2020-02-27 ENCOUNTER — Emergency Department (HOSPITAL_COMMUNITY)
Admission: EM | Admit: 2020-02-27 | Discharge: 2020-02-27 | Disposition: A | Discharge status: Home / Self Care | Payer: Medicaid Other | Attending: Emergency Medicine | Admitting: Emergency Medicine

## 2020-02-27 ENCOUNTER — Other Ambulatory Visit: Payer: Self-pay

## 2020-02-27 ENCOUNTER — Emergency Department (HOSPITAL_COMMUNITY): Payer: Medicaid Other

## 2020-02-27 ENCOUNTER — Encounter (HOSPITAL_COMMUNITY): Payer: Self-pay

## 2020-02-27 DIAGNOSIS — Z79899 Other long term (current) drug therapy: Secondary | ICD-10-CM | POA: Diagnosis not present

## 2020-02-27 DIAGNOSIS — R002 Palpitations: Secondary | ICD-10-CM | POA: Diagnosis present

## 2020-02-27 DIAGNOSIS — I10 Essential (primary) hypertension: Secondary | ICD-10-CM | POA: Diagnosis not present

## 2020-02-27 DIAGNOSIS — Z7982 Long term (current) use of aspirin: Secondary | ICD-10-CM | POA: Diagnosis not present

## 2020-02-27 DIAGNOSIS — I4891 Unspecified atrial fibrillation: Secondary | ICD-10-CM | POA: Diagnosis not present

## 2020-02-27 DIAGNOSIS — F17210 Nicotine dependence, cigarettes, uncomplicated: Secondary | ICD-10-CM | POA: Diagnosis not present

## 2020-02-27 LAB — BASIC METABOLIC PANEL
Anion gap: 14 (ref 5–15)
BUN: 9 mg/dL (ref 6–20)
CO2: 22 mmol/L (ref 22–32)
Calcium: 9.1 mg/dL (ref 8.9–10.3)
Chloride: 99 mmol/L (ref 98–111)
Creatinine, Ser: 1.2 mg/dL (ref 0.61–1.24)
GFR calc Af Amer: >60 mL/min (ref >60)
GFR calc non Af Amer: >60 mL/min (ref >60)
Glucose, Bld: 127 mg/dL — ABNORMAL HIGH (ref 70–99)
Potassium: 3.5 mmol/L (ref 3.5–5.1)
Sodium: 135 mmol/L (ref 135–145)

## 2020-02-27 LAB — CBC
HCT: 48.3 % (ref 39.0–52.0)
Hemoglobin: 15.8 g/dL (ref 13.0–17.0)
MCH: 28.8 pg (ref 26.0–34.0)
MCHC: 32.7 g/dL (ref 30.0–36.0)
MCV: 88 fL (ref 80.0–100.0)
Platelets: 152 10*3/uL (ref 150–400)
RBC: 5.49 MIL/uL (ref 4.22–5.81)
RDW: 12.9 % (ref 11.5–15.5)
WBC: 4.4 10*3/uL (ref 4.0–10.5)
nRBC: 0 % (ref 0.0–0.2)

## 2020-02-27 LAB — TROPONIN I (HIGH SENSITIVITY): Troponin I (High Sensitivity): 12 ng/L (ref <18)

## 2020-02-27 MED ORDER — DILTIAZEM HCL 25 MG/5ML IV SOLN
15.0000 mg | Freq: Once | INTRAVENOUS | Status: AC
  Administered 2020-02-27: 15 mg via INTRAVENOUS
  Filled 2020-02-27: qty 5

## 2020-02-27 MED ORDER — DILTIAZEM HCL 25 MG/5ML IV SOLN: 20.0000 mg | Freq: Once | INTRAVENOUS | Status: DC

## 2020-02-27 MED ORDER — DILTIAZEM HCL-DEXTROSE 125-5 MG/125ML-% IV SOLN (PREMIX)
5.0000 mg/h | INTRAVENOUS | Status: DC
  Administered 2020-02-27: 5 mg/h via INTRAVENOUS
  Filled 2020-02-27: qty 125

## 2020-02-27 MED ORDER — DILTIAZEM HCL ER COATED BEADS 180 MG PO CP24: ORAL_CAPSULE | ORAL | 0 refills | Status: AC

## 2020-02-27 NOTE — ED Provider Notes (Signed)
[facility] EMERGENCY DEPARTMENT Provider Note   CSN: [id number] Arrival date & time: 02/27/20  1838     History Chief Complaint  Patient presents with  . Palpitations  . Atrial Fibrillation    Dayan Allman is a 39 y.o. male.  The history is provided by the patient and medical records. No language interpreter was used.  Palpitations Atrial Fibrillation     39 year old male with history of paroxysmal atrial fibrillation  brought here via EMS for evaluation of heart palpitation.  Patient states he is 7 days into his Covid infection and is feeling better from that standpoint.  Today he was taking over-the-counter medication including Robitussin, Mucinex, and Tylenol when he developed heart palpitation.  Symptoms started approximately 1 hour ago.  He report sensation of heart racing and felt a bit lightheadedness initially.  EMS arrived and noted that his heart rate was in the 220s.  He did receive the medication diltiazem 20mg  RIH and heart rate did improve to 130-170.  Patient felt better.  He denies any associated chest pain, diaphoresis, fever, abdominal pain or back pain.  He was not vaccinated for COVID-19.  He does not have any medication available for his atrial fibrillation   Past Medical History:  Diagnosis Date  . A-fib   . Hypertension     Patient Active Problem List   Diagnosis Date Noted  . Hyperlipidemia 12/26/2013  . Paroxysmal atrial fibrillation (HCC) 11/26/2013  . Moderate obesity 11/26/2013  . Essential hypertension 11/26/2013  . Tobacco abuse 11/26/2013  . Evaluate for Obstructive sleep apnea 11/26/2013    No past surgical history on file.     Family History  Problem Relation Age of Onset  . Alcoholism Mother   . Other Father        motocycle accident  . Other Sister        P 10 MUTATION;  cowden syndrome  . Cancer - Other Maternal Grandmother     Social History   Tobacco Use  . Smoking status: Current Every Day Smoker     Packs/day: 0.50    Years: 3.00    Pack years: 1.50    Types: Cigarettes  Substance Use Topics  . Alcohol use: Yes    Comment: 12 pack beer on the weekend  . Drug use: No    Home Medications Prior to Admission medications   Medication Sig Start Date End Date Taking? Authorizing Provider  aspirin 81 MG tablet Take 81 mg by mouth daily.    [provider]  atorvastatin (LIPITOR) 20 MG tablet Take 1 tablet (20 mg total) by mouth daily. 01/20/14   01/22/14, MD  diltiazem (CARDIZEM CD) 180 MG 24 hr capsule Take one capsule at night 11/26/13   11/28/13, MD  diltiazem (CARDIZEM CD) 180 MG 24 hr capsule Take 1 capsule (180 mg total) by mouth daily. 03/28/14   03/30/14, PA-C  lisinopril (PRINIVIL,ZESTRIL) 40 MG tablet Take 1 tablet (40 mg total) by mouth daily. 12/30/13   01/01/14, NP  lisinopril (PRINIVIL,ZESTRIL) 40 MG tablet Take 1 tablet (40 mg total) by mouth daily. 03/28/14   03/30/14, PA-C    Allergies    Patient has no known allergies.  Review of Systems   Review of Systems  Cardiovascular: Positive for palpitations.  All other systems reviewed and are negative.   Physical Exam Updated Vital Signs BP (!) 143/102   Pulse 99   Temp 99.6 F (37.6  C) (Oral)   Resp 17   Ht 5' 10.5" (1.791 m)   Wt 103 kg   SpO2 97%   BMI 32.11 kg/m   Physical Exam Vitals and nursing note reviewed.  Constitutional:      General: He is not in acute distress.    Appearance: He is well-developed.  HENT:     Head: Atraumatic.  Eyes:     Conjunctiva/sclera: Conjunctivae normal.  Cardiovascular:     Rate and Rhythm: Tachycardia present. Rhythm irregular.     Heart sounds: Normal heart sounds.  Pulmonary:     Effort: Pulmonary effort is normal.     Breath sounds: Normal breath sounds.  Abdominal:     Palpations: Abdomen is soft.     Tenderness: There is no abdominal tenderness.  Musculoskeletal:        General: No swelling.     Cervical back: Neck  supple.  Skin:    Findings: No rash.  Neurological:     Mental Status: He is alert and oriented to person, place, and time.  Psychiatric:        Mood and Affect: Mood normal.     ED Results / Procedures / Treatments   Labs (all labs ordered are listed, but only abnormal results are displayed) Labs Reviewed  BASIC METABOLIC PANEL - Abnormal; Notable for the following components:      Result Value   Glucose, Bld 127 (*)    All other components within normal limits  CBC  TROPONIN I (HIGH SENSITIVITY)  TROPONIN I (HIGH SENSITIVITY)    EKG EKG Interpretation  Date/Time:  Thursday February 27 2020 18:47:31 EDT Ventricular Rate:  136 PR Interval:    QRS Duration: 82 QT Interval:  287 QTC Calculation: 432 R Axis:   100 Text Interpretation: Atrial fibrillation Right axis deviation Borderline repolarization abnormality Confirmed by Lorre Nick ([id number]) on 02/27/2020 6:52:28 PM   Radiology DG Chest Port 1 View  Result Date: 02/27/2020 CLINICAL DATA:  Heart palpitations EXAM: PORTABLE CHEST 1 VIEW COMPARISON:  03/27/2014 FINDINGS: Heart size and vascularity normal. Lung volume normal. Negative for pleural effusion Patchy mild airspace disease in the left mid lung. This was not present previously. IMPRESSION: Mild airspace disease in the left lung.  Possible pneumonia. Electronically Signed   By: Marlan Palau M.D.   On: 02/27/2020 19:41    Procedures .Critical Care Performed by: Fayrene Helper, PA-C Authorized by: Fayrene Helper, PA-C   Critical care provider statement:    Critical care time (minutes):  32   Critical care was time spent personally by me on the following activities:  Discussions with consultants, evaluation of patient's response to treatment, examination of patient, ordering and performing treatments and interventions, ordering and review of laboratory studies, ordering and review of radiographic studies, pulse oximetry, re-evaluation of patient's condition,  obtaining history from patient or surrogate and review of old charts   (including critical care time)  Medications Ordered in ED Medications  diltiazem (CARDIZEM) 125 mg in dextrose 5% 125 mL (1 mg/mL) infusion (5 mg/hr Intravenous New Bag/Given 02/27/20 2039)  diltiazem (CARDIZEM) injection 15 mg (15 mg Intravenous Bolus 02/27/20 2039)    ED Course  I have reviewed the triage vital signs and the nursing notes.  Pertinent labs & imaging results that were available during my care of the patient were reviewed by me and considered in my medical decision making (see chart for details).    MDM Rules/Calculators/A&P  BP (!) 143/102   Pulse 99   Temp 99.6 F (37.6 C) (Oral)   Resp 17   Ht 5' 10.5" (1.791 m)   Wt 103 kg   SpO2 97%   BMI 32.11 kg/m   Final Clinical Impression(s) / ED Diagnoses Final diagnoses:  Atrial fibrillation with RVR (HCC)    Rx / DC Orders ED Discharge Orders         Ordered    diltiazem (CARDIZEM CD) 180 MG 24 hr capsule        02/27/20 2245          7:05 PM Patient with history of paroxysmal atrial fibrillation not on any medication at this time, who is also was diagnosed with COVID-19 approximately a week ago presenting with heart palpitation and EKG consistent with atrial fibrillation with RVR.  He did receive diltiazem prior to arrival.  Symptoms started approximately an hour ago.  He is hemodynamically stable at this time therefore will initiate additional diltiazem Bolus as well as drip.  Care discussed with Dr. Freida Busman.  9:20 PM Pt received diltizem bolus as well as drip and have now converted to sinus rhythm.  CXR shows mild airspace disease in the left lung, this is likely covid pneumonia.  No hypoxia.  Will consult cardiology for recommendation.   9:29 PM Appreciate consultation from on-call cardiologist, Dr. Daphine Deutscher, who will have the office contact patient for outpatient follow-up with the A. fib clinic.  She mention  if patient is amenable, he can be restarted on diltiazem 180 mg daily as he was previously.  10:12 PM At this time patient felt better.  He is stable for discharge.  He does want a prescription for his diltiazem.  He will follow-up closely with A. fib clinic for further managements of his condition.  Return precaution discussed.  Luster Raffo was evaluated in Emergency Department on 02/27/2020 for the symptoms described in the history of present illness. He was evaluated in the context of the global COVID-19 pandemic, which necessitated consideration that the patient might be at risk for infection with the SARS-CoV-2 virus that causes COVID-19. Institutional protocols and algorithms that pertain to the evaluation of patients at risk for COVID-19 are in a state of rapid change based on information released by regulatory bodies including the CDC and federal and state organizations. These policies and algorithms were followed during the patient's care in the ED.    Fayrene Helper, PA-C 02/27/20 2304    Lorre Nick, MD 03/02/20 [phone]

## 2020-02-27 NOTE — ED Notes (Signed)
Discharge instructions discussed with pt. Pt verbalized understanding. Pt stable and ambulatory. No signature pad available. 

## 2020-02-27 NOTE — ED Notes (Signed)
Wife would like to be called once decision is made about POC

## 2020-02-27 NOTE — ED Provider Notes (Signed)
Medical screening examination/treatment/procedure(s) were conducted as a shared visit with non-physician practitioner(s) and myself.  I personally evaluated the patient during the encounter.  EKG Interpretation  Date/Time:  Thursday February 27 2020 18:47:31 EDT Ventricular Rate:  136 PR Interval:    QRS Duration: 82 QT Interval:  287 QTC Calculation: 432 R Axis:   100 Text Interpretation: Atrial fibrillation Right axis deviation Borderline repolarization abnormality Confirmed by Lorre Nick ([id number]) on 02/27/2020 6:69:20 PM 39 year old male presents here with palpitations. History of paroxysmal A. fib. On arrival here is A. fib with RVR. Had received Cardizem prior to arrival here. We bolused with Cardizem and placed on drip. Patient agreed to sinus rhythm. He was recent diagnosed with Covid but is doing well from that standpoint. Will consult cardiology and arrange follow-up   Lorre Nick, MD 02/27/20 2129

## 2020-02-27 NOTE — Discharge Instructions (Signed)
You have been evaluated for your atrial fibrillation.  Please resume taking diltiazem as prescribed.  Call and follow-up closely with the atrial fibrillation clinic for further managements of your condition.  Return if you have any concern.

## 2020-02-27 NOTE — ED Triage Notes (Signed)
Pt from home with ems, tested positive for COVID last week, took some robitussin for his cough around 5pm and suddenly felt heart palpitations, denies chest pain or cough. HR initially 200-220 upon EMS arrival, pt given 20mg  cardizem en route. HR ranging from 130-170 upon arrival. Pt a.o

## 2020-02-27 NOTE — ED Notes (Signed)
Dionis Autry wife [phone] would like an update on the pt

## 2020-02-28 ENCOUNTER — Telehealth (HOSPITAL_COMMUNITY): Payer: Self-pay

## 2020-02-28 NOTE — Telephone Encounter (Signed)
Patient on ED report. Tried reaching out to telephone numbers on file numbers unavailable.

## 2020-03-15 NOTE — Progress Notes (Deleted)
Cardiology Office Note:    Date:  03/15/2020   ID:  Erik Walter, DOB 1980/09/08, MRN [id number]  PCP:  Lennette Bihari, MD  [facility] Cardiologist:  No primary care provider on file.  [facility] Electrophysiologist:  None   Referring MD: Lennette Bihari, MD    History of Present Illness:    Erik Walter is a 39 y.o. male with a hx of HTN and pAfib who was referred by Dr. Tresa Endo for management of atrial fibrillation.  Patient recentky in ED on 02/27/20. Note reviewed. Patient presented with worsening palpitations found to be in Afib with RVR about 7days after being diagnosed with COVID. Reportedly, HR 220s on EMS evaluation. Was placed on cardizem gtt with improvement of rates. His symptoms improved and he was discharged home on dilt PO with plans for cardiology follow-up.  Past Medical History:  Diagnosis Date   A-fib [facility])    Hypertension    Palpitations     No past surgical history on file.  Current Medications: No outpatient medications have been marked as taking for the 03/18/20 encounter (Appointment) with Meriam Sprague, MD.     Allergies:   Patient has no known allergies.   Social History   Socioeconomic History   Marital status: Married    Spouse name: Not on file   Number of children: Not on file   Years of education: Not on file   Highest education level: Not on file  Occupational History   Not on file  Tobacco Use   Smoking status: Current Every Day Smoker    Packs/day: 0.50    Years: 3.00    Pack years: 1.50    Types: Cigarettes  Substance and Sexual Activity   Alcohol use: Yes    Comment: 12 pack beer on the weekend   Drug use: No   Sexual activity: Not on file  Other Topics Concern   Not on file  Social History Narrative   Not on file   Social Determinants of Health   Financial Resource Strain:    Difficulty of Paying Living Expenses: Not on file  Food Insecurity:    Worried About Running Out of Food  in the Last Year: Not on file   Ran Out of Food in the Last Year: Not on file  Transportation Needs:    Lack of Transportation (Medical): Not on file   Lack of Transportation (Non-Medical): Not on file  Physical Activity:    Days of Exercise per Week: Not on file   Minutes of Exercise per Session: Not on file  Stress:    Feeling of Stress : Not on file  Social Connections:    Frequency of Communication with Friends and Family: Not on file   Frequency of Social Gatherings with Friends and Family: Not on file   Attends Religious Services: Not on file   Active Member of Clubs or Organizations: Not on file   Attends Banker Meetings: Not on file   Marital Status: Not on file     Family History: The patient's ***family history includes Alcoholism in his mother; Cancer - Other in his maternal grandmother; Other in his father and sister.  ROS:   Please see the history of present illness.    *** All other systems reviewed and are negative.  EKGs/Labs/Other Studies Reviewed:    The following studies were reviewed today: TTE 2013-09-22: - Left ventricle: The cavity size was normal. Wall thickness was  increased in a  pattern of mild LVH. Systolic function was normal.  The estimated ejection fraction was in the range of 55% to 60%.  Wall motion was normal; there were no regional wall motion  abnormalities. The study is not technically sufficient to allow  evaluation of LV diastolic function.  - Left atrium: LA Volume/BSA= 12.6 ml/m2. The atrium was normal in  size.  - Right atrium: The atrium was normal in size.    EKG:  EKG is *** ordered today.  The ekg ordered today demonstrates ***  Recent Labs: 02/27/2020: BUN 9; Creatinine, Ser 1.20; Hemoglobin 15.8; Platelets 152; Potassium 3.5; Sodium 135  Recent Lipid Panel    Component Value Date/Time   CHOL 264 (H) 12/13/2013 0823   TRIG 137 12/13/2013 0823   HDL 64 12/13/2013 0823   CHOLHDL 4.1  12/13/2013 0823   VLDL 27 12/13/2013 0823   LDLCALC 173 (H) 12/13/2013 0823     Risk Assessment/Calculations:   {Does this patient have ATRIAL FIBRILLATION?[phone]}   Physical Exam:    VS:  There were no vitals taken for this visit.    Wt Readings from Last 3 Encounters:  02/27/20 227 lb (103 kg)  02/21/14 250 lb (113.4 kg)  01/08/14 251 lb (113.9 kg)     GEN: *** Well nourished, well developed in no acute distress HEENT: Normal NECK: No JVD; No carotid bruits LYMPHATICS: No lymphadenopathy CARDIAC: ***RRR, no murmurs, rubs, gallops RESPIRATORY:  Clear to auscultation without rales, wheezing or rhonchi  ABDOMEN: Soft, non-tender, non-distended MUSCULOSKELETAL:  No edema; No deformity  SKIN: Warm and dry NEUROLOGIC:  Alert and oriented x 3 PSYCHIATRIC:  Normal affect   ASSESSMENT:    No diagnosis found. PLAN:    In order of problems listed above:  #Afib with RVR:    Medication Adjustments/Labs and Tests Ordered: Current medicines are reviewed at length with the patient today.  Concerns regarding medicines are outlined above.  No orders of the defined types were placed in this encounter.  No orders of the defined types were placed in this encounter.   There are no Patient Instructions on file for this visit.   Signed, Meriam Sprague, MD  03/15/2020 5:54 PM    [facility]

## 2020-03-18 ENCOUNTER — Ambulatory Visit: Payer: Medicaid Other | Admitting: Cardiology

## 2021-08-05 IMAGING — DX DG CHEST 1V PORT
1 series · 1 of 1 positions shown · non-contrast
Comparison: 03/27/2014

CLINICAL DATA: Heart palpitations

EXAM:
PORTABLE CHEST 1 VIEW

[chest]
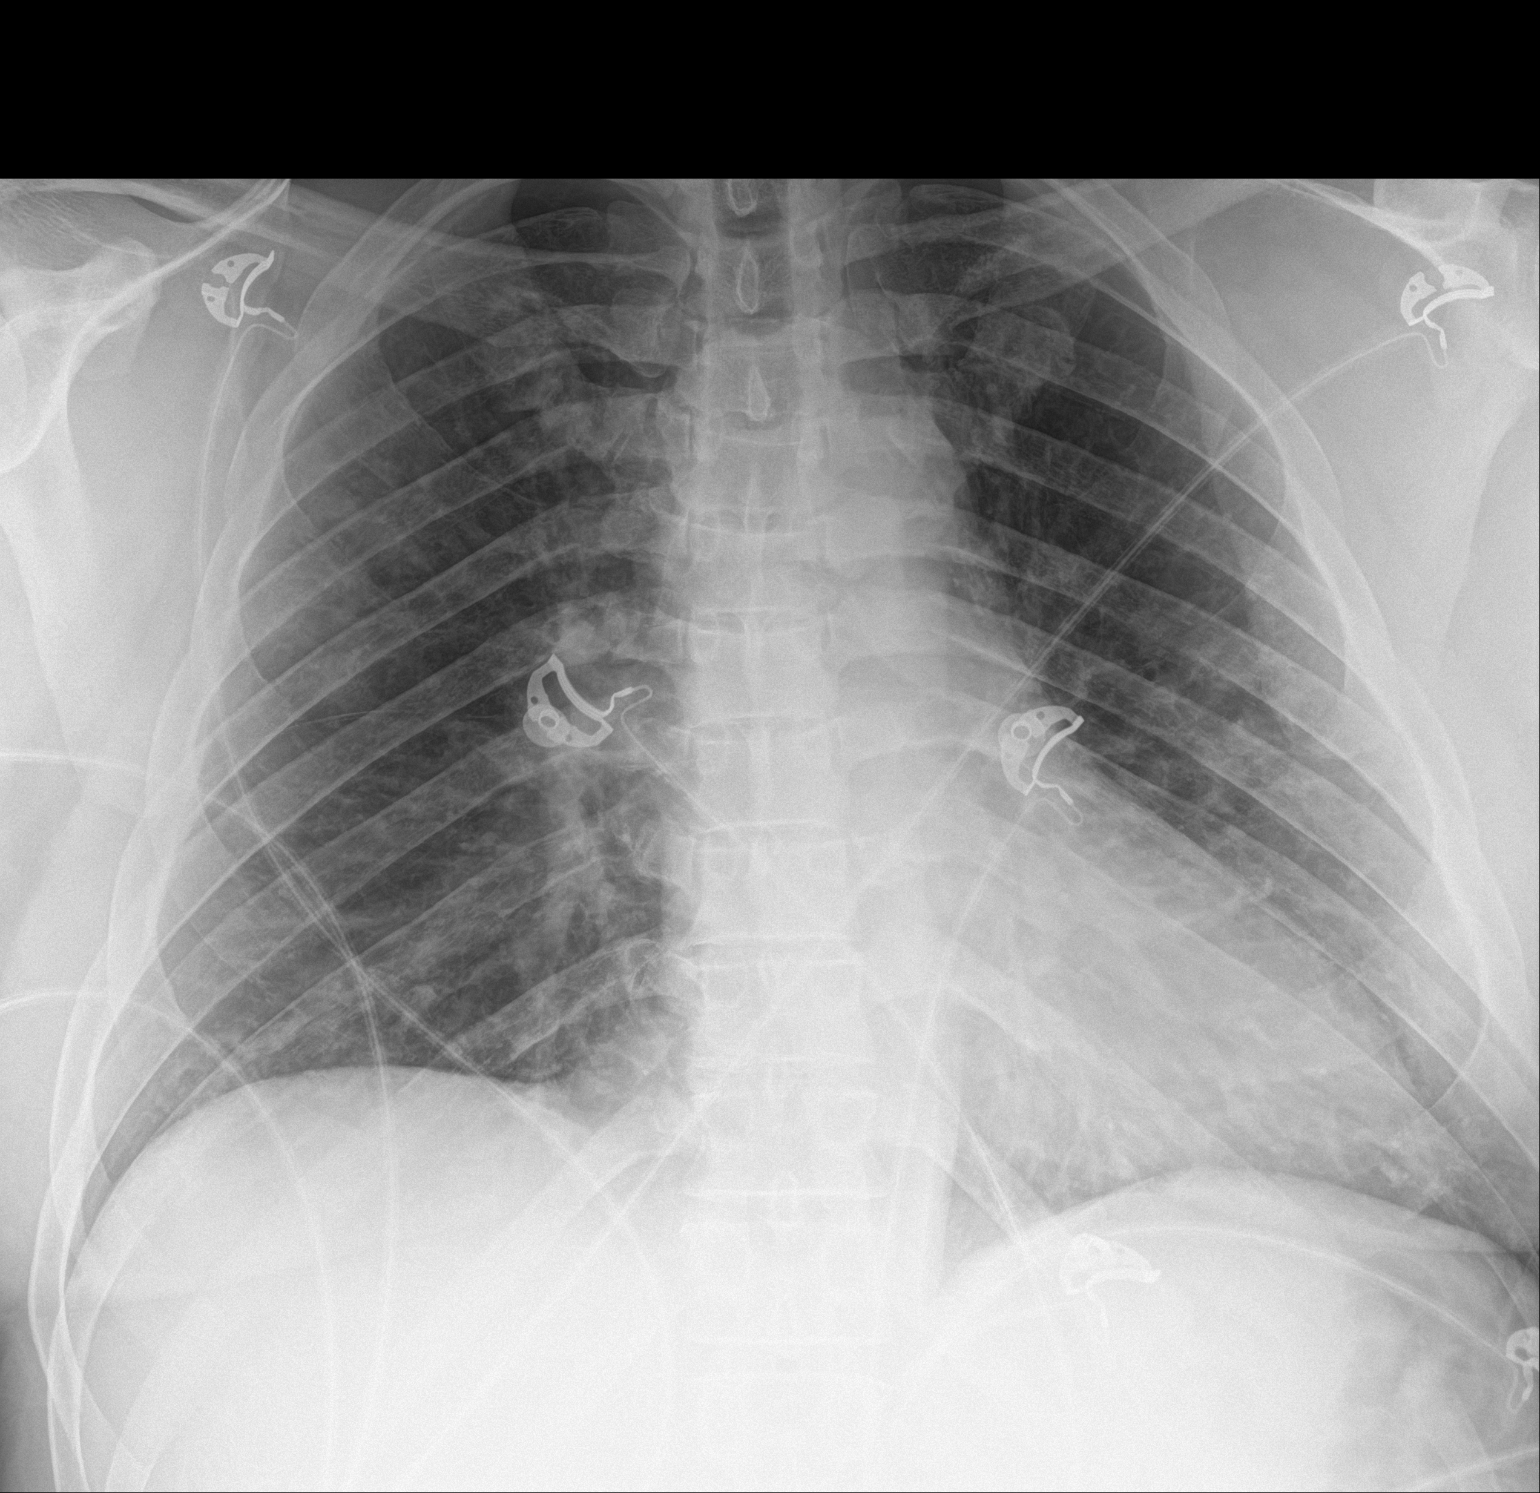

[1 of 1 positions shown; findings below may reference images not displayed]

FINDINGS: Heart size and vascularity normal. Lung volume normal. Negative for
pleural effusion

Patchy mild airspace disease in the left mid lung. This was not
present previously.
IMPRESSION: Mild airspace disease in the left lung.  Possible pneumonia.
